# Patient Record
Sex: Female | Born: 1980 | Race: Asian | Hispanic: No | Marital: Single | Smoking: Current some day smoker
Health system: Southern US, Community
[De-identification: ages and names within clinical notes are randomized; demographics above are authoritative.]

## PROBLEM LIST (undated history)

## (undated) DIAGNOSIS — F32A Depression, unspecified: Secondary | ICD-10-CM

## (undated) DIAGNOSIS — S2239XA Fracture of one rib, unspecified side, initial encounter for closed fracture: Secondary | ICD-10-CM

## (undated) DIAGNOSIS — S2249XA Multiple fractures of ribs, unspecified side, initial encounter for closed fracture: Secondary | ICD-10-CM

## (undated) DIAGNOSIS — F329 Major depressive disorder, single episode, unspecified: Secondary | ICD-10-CM

## (undated) DIAGNOSIS — G43909 Migraine, unspecified, not intractable, without status migrainosus: Secondary | ICD-10-CM

## (undated) DIAGNOSIS — M549 Dorsalgia, unspecified: Secondary | ICD-10-CM

## (undated) DIAGNOSIS — L309 Dermatitis, unspecified: Secondary | ICD-10-CM

## (undated) DIAGNOSIS — G8929 Other chronic pain: Secondary | ICD-10-CM

## (undated) DIAGNOSIS — F419 Anxiety disorder, unspecified: Secondary | ICD-10-CM

## (undated) HISTORY — DX: Fracture of one rib, unspecified side, initial encounter for closed fracture: S22.39XA

## (undated) HISTORY — DX: Anxiety disorder, unspecified: F41.9

## (undated) HISTORY — PX: NO PAST SURGERIES: SHX2092

## (undated) HISTORY — DX: Major depressive disorder, single episode, unspecified: F32.9

## (undated) HISTORY — DX: Dermatitis, unspecified: L30.9

## (undated) HISTORY — DX: Depression, unspecified: F32.A

## (undated) HISTORY — DX: Multiple fractures of ribs, unspecified side, initial encounter for closed fracture: S22.49XA

## (undated) HISTORY — DX: Other chronic pain: G89.29

## (undated) HISTORY — DX: Dorsalgia, unspecified: M54.9

---

## 2002-03-22 ENCOUNTER — Other Ambulatory Visit: Admission: RE | Admit: 2002-03-22 | Discharge: 2002-03-22 | Payer: Self-pay | Admitting: *Deleted

## 2002-06-11 ENCOUNTER — Encounter: Payer: Self-pay | Admitting: Emergency Medicine

## 2002-06-11 ENCOUNTER — Emergency Department (HOSPITAL_COMMUNITY): Admission: EM | Admit: 2002-06-11 | Discharge: 2002-06-11 | Payer: Self-pay | Admitting: Emergency Medicine

## 2006-03-03 ENCOUNTER — Other Ambulatory Visit: Admission: RE | Admit: 2006-03-03 | Discharge: 2006-03-03 | Payer: Self-pay | Admitting: Obstetrics and Gynecology

## 2006-09-29 ENCOUNTER — Inpatient Hospital Stay (HOSPITAL_COMMUNITY): Admission: AD | Admit: 2006-09-29 | Discharge: 2006-09-29 | Payer: Self-pay | Admitting: Obstetrics and Gynecology

## 2006-10-01 ENCOUNTER — Inpatient Hospital Stay (HOSPITAL_COMMUNITY): Admission: AD | Admit: 2006-10-01 | Discharge: 2006-10-03 | Payer: Self-pay | Admitting: Obstetrics and Gynecology

## 2009-05-26 ENCOUNTER — Emergency Department (HOSPITAL_BASED_OUTPATIENT_CLINIC_OR_DEPARTMENT_OTHER): Admission: EM | Admit: 2009-05-26 | Discharge: 2009-05-26 | Payer: Self-pay | Admitting: Emergency Medicine

## 2013-06-12 ENCOUNTER — Emergency Department (HOSPITAL_BASED_OUTPATIENT_CLINIC_OR_DEPARTMENT_OTHER)
Admission: EM | Admit: 2013-06-12 | Discharge: 2013-06-12 | Disposition: A | Discharge status: Home / Self Care | Payer: BC Managed Care – PPO | Attending: Emergency Medicine | Admitting: Emergency Medicine

## 2013-06-12 ENCOUNTER — Encounter (HOSPITAL_BASED_OUTPATIENT_CLINIC_OR_DEPARTMENT_OTHER): Payer: Self-pay | Admitting: Emergency Medicine

## 2013-06-12 ENCOUNTER — Emergency Department (HOSPITAL_BASED_OUTPATIENT_CLINIC_OR_DEPARTMENT_OTHER): Payer: BC Managed Care – PPO

## 2013-06-12 DIAGNOSIS — IMO0002 Reserved for concepts with insufficient information to code with codable children: Secondary | ICD-10-CM | POA: Insufficient documentation

## 2013-06-12 DIAGNOSIS — Z3202 Encounter for pregnancy test, result negative: Secondary | ICD-10-CM | POA: Insufficient documentation

## 2013-06-12 DIAGNOSIS — S301XXA Contusion of abdominal wall, initial encounter: Secondary | ICD-10-CM | POA: Insufficient documentation

## 2013-06-12 DIAGNOSIS — G43909 Migraine, unspecified, not intractable, without status migrainosus: Secondary | ICD-10-CM | POA: Insufficient documentation

## 2013-06-12 DIAGNOSIS — Z79899 Other long term (current) drug therapy: Secondary | ICD-10-CM | POA: Insufficient documentation

## 2013-06-12 DIAGNOSIS — Y9389 Activity, other specified: Secondary | ICD-10-CM | POA: Insufficient documentation

## 2013-06-12 DIAGNOSIS — Y929 Unspecified place or not applicable: Secondary | ICD-10-CM | POA: Insufficient documentation

## 2013-06-12 DIAGNOSIS — F17200 Nicotine dependence, unspecified, uncomplicated: Secondary | ICD-10-CM | POA: Insufficient documentation

## 2013-06-12 HISTORY — DX: Migraine, unspecified, not intractable, without status migrainosus: G43.909

## 2013-06-12 LAB — BASIC METABOLIC PANEL
BUN: 8 mg/dL (ref 6–23)
CO2: 26 mEq/L (ref 19–32)
Chloride: 103 mEq/L (ref 96–112)
GFR calc non Af Amer: >90 mL/min (ref >90)
Glucose, Bld: 94 mg/dL (ref 70–99)
Potassium: 3.9 mEq/L (ref 3.5–5.1)
Sodium: 137 mEq/L (ref 135–145)

## 2013-06-12 LAB — CBC WITH DIFFERENTIAL/PLATELET
Basophils Absolute: 0 10*3/uL (ref 0.0–0.1)
Basophils Relative: 1 % (ref 0–1)
Eosinophils Absolute: 0.2 10*3/uL (ref 0.0–0.7)
HCT: 34.5 % — ABNORMAL LOW (ref 36.0–46.0)
Hemoglobin: 11.3 g/dL — ABNORMAL LOW (ref 12.0–15.0)
Lymphocytes Relative: 28 % (ref 12–46)
Lymphs Abs: 1.4 10*3/uL (ref 0.7–4.0)
MCH: 28.3 pg (ref 26.0–34.0)
MCHC: 32.8 g/dL (ref 30.0–36.0)
Monocytes Absolute: 0.5 10*3/uL (ref 0.1–1.0)
Monocytes Relative: 10 % (ref 3–12)
Neutro Abs: 2.9 10*3/uL (ref 1.7–7.7)
Neutrophils Relative %: 58 % (ref 43–77)
Platelets: 227 10*3/uL (ref 150–400)
RBC: 3.99 MIL/uL (ref 3.87–5.11)
RDW: 12.3 % (ref 11.5–15.5)
WBC: 5 10*3/uL (ref 4.0–10.5)

## 2013-06-12 LAB — URINALYSIS, ROUTINE W REFLEX MICROSCOPIC
Glucose, UA: NEGATIVE mg/dL
Ketones, ur: NEGATIVE mg/dL
Leukocytes, UA: NEGATIVE
Nitrite: NEGATIVE
Specific Gravity, Urine: 1.016 (ref 1.005–1.030)
Urobilinogen, UA: 1 mg/dL (ref 0.0–1.0)
pH: 7 (ref 5.0–8.0)

## 2013-06-12 LAB — PREGNANCY, URINE: Preg Test, Ur: NEGATIVE

## 2013-06-12 MED ORDER — IOHEXOL 300 MG/ML  SOLN
50.0000 mL | Freq: Once | INTRAMUSCULAR | Status: AC | PRN
  Administered 2013-06-12: 50 mL via ORAL

## 2013-06-12 MED ORDER — IOHEXOL 300 MG/ML  SOLN
100.0000 mL | Freq: Once | INTRAMUSCULAR | Status: AC | PRN
  Administered 2013-06-12: 100 mL via INTRAVENOUS

## 2013-06-12 MED ORDER — MORPHINE SULFATE 2 MG/ML IJ SOLN
2.0000 mg | Freq: Once | INTRAMUSCULAR | Status: AC
  Administered 2013-06-12: 2 mg via INTRAVENOUS
  Filled 2013-06-12: qty 1

## 2013-06-12 MED ORDER — HYDROCODONE-ACETAMINOPHEN 5-325 MG PO TABS: 1.0000 | ORAL_TABLET | ORAL | Status: DC | PRN

## 2013-06-12 MED ORDER — ONDANSETRON HCL 4 MG/2ML IJ SOLN
4.0000 mg | Freq: Once | INTRAMUSCULAR | Status: AC
  Administered 2013-06-12: 4 mg via INTRAVENOUS
  Filled 2013-06-12: qty 2

## 2013-06-12 MED ORDER — SODIUM CHLORIDE 0.9 % IV BOLUS (SEPSIS)
500.0000 mL | Freq: Once | INTRAVENOUS | Status: AC
  Administered 2013-06-12: 500 mL via INTRAVENOUS

## 2013-06-12 NOTE — ED Notes (Signed)
Patients blood pressure reported to Dr. Blinda Leatherwood. Order received for NS bolus

## 2013-06-12 NOTE — ED Provider Notes (Addendum)
History    CSN: [id number] Arrival date & time 06/12/13  1239  First MD Initiated Contact with Patient 06/12/13 1306     Chief Complaint  Patient presents with  . Abdominal Pain   (Consider location/radiation/quality/duration/timing/severity/associated sxs/prior Treatment) HPI Comments: Patient presents to the ER for evaluation of abdominal pain. 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 She says that she noticed intermittently, with changes in position for a couple of days. Today, however, she has had constant more severe pain. She has not had any nausea, vomiting or diarrhea associated with symptoms. She says she can feel a lump in the right lower groin area. The area is tender to touch.  Patient is a 32 y.o. female presenting with abdominal pain.  Abdominal Pain Associated symptoms include abdominal pain.   Past Medical History  Diagnosis Date  . Migraines    History reviewed. No pertinent past surgical history. No family history on file. History  Substance Use Topics  . Smoking status: Current Every Day Smoker -- 0.50 packs/day  . Smokeless tobacco: Not on file  . Alcohol Use: No   OB History   Grav Para Term Preterm Abortions TAB SAB Ect Mult Living                 Review of Systems  Gastrointestinal: Positive for abdominal pain.  All other systems reviewed and are negative.    Allergies  Acetaminophen  Home Medications   Current Outpatient Rx  Name  Route  Sig  Dispense  Refill  . nortriptyline (PAMELOR) 10 MG capsule   Oral   Take 20 mg by mouth at bedtime.          BP 105/75  Pulse 97  Temp(Src) 98.3 F (36.8 C) (Oral)  Resp 16  Ht 5\' 2"  (1.575 m)  Wt 106 lb (48.081 kg)  BMI 19.38 kg/m2  SpO2 100%  LMP 06/05/2013 Physical Exam  Constitutional: She is oriented to person, place, and time. She appears well-developed and well-nourished. No distress.  HENT:    Head: Normocephalic and atraumatic.  Right Ear: Hearing normal.  Left Ear: Hearing normal.  Nose: Nose normal.  Mouth/Throat: Oropharynx is clear and moist and mucous membranes are normal.  Eyes: Conjunctivae and EOM are normal. Pupils are equal, round, and reactive to light.  Neck: Normal range of motion. Neck supple.  Cardiovascular: Regular rhythm, S1 normal and S2 normal.  Exam reveals no gallop and no friction rub.   No murmur heard. Pulmonary/Chest: Effort normal and breath sounds normal. No respiratory distress. She exhibits no tenderness.  Abdominal: Soft. Normal appearance and bowel sounds are normal. There is no hepatosplenomegaly. There is tenderness. There is no rebound, no guarding, no tenderness at McBurney's point and negative Murphy's sign. No hernia.    Musculoskeletal: Normal range of motion.  Neurological: She is alert and oriented to person, place, and time. She has normal strength. No cranial nerve deficit or sensory deficit. Coordination normal. GCS eye subscore is 4. GCS verbal subscore is 5. GCS motor subscore is 6.  Skin: Skin is warm, dry and intact. No rash noted. No cyanosis.  Psychiatric: She has a normal mood and affect. Her speech is normal and behavior is normal. Thought content normal.    ED Course  Procedures (including critical care time) Labs Reviewed  CBC WITH DIFFERENTIAL - Abnormal; Notable for the following:    Hemoglobin 11.3 (*)  HCT 34.5 (*)    All other components within normal limits  PREGNANCY, URINE  URINALYSIS, ROUTINE W REFLEX MICROSCOPIC  BASIC METABOLIC PANEL   Ct Abdomen Pelvis W Contrast  06/12/2013   *RADIOLOGY REPORT*  Clinical Data: Severe right lower quadrant abdominal pain.  CT ABDOMEN AND PELVIS WITH CONTRAST  Technique:  Multidetector CT imaging of the abdomen and pelvis was performed following the standard protocol during bolus administration of intravenous contrast.  Contrast: OMNIPAQUE IOHEXOL 300 MG/ML  SOLN,  50mL OMNIPAQUE IOHEXOL 300 MG/ML  SOLN  Comparison: None.  Findings: The lung bases are clear. No pleural or pericardial effusion.  There is a lesion in the right rectus abdominus muscle measuring 6.3 cm transverse by 3.3 cm AP by 8.8 cm cranial-caudal.  The lesion demonstrates mixed attenuation with a small focus of decreased attenuation inferiorly.  There appears to be a feeding vessel into the lesion.  The lesion creates mass effect on the urinary bladder.  The gallbladder, liver, spleen, adrenal glands, pancreas and kidneys appear normal.  No lymphadenopathy or fluid is seen within the abdomen.  Uterus and adnexa appear normal.  No focal bony abnormality is identified.  IMPRESSION: Lesion in the right rectus abdominus muscle does not have the typical appearance of hematoma and is worrisome for an underlying mass lesion.  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 Recommend correlation with non emergent MRI with contrast.   Original Report Authenticated By: Holley Dexter, M.D.   Diagnosis: Abdominal pain, likely abdominal rectus hematoma  MDM  Patient presents to the ER for evaluation of right-sided abdominal pain. Pain first was felt while doing crunches while working out. 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 This morning she did work out on the elliptical and lifted weights, pain significantly worsened. She did have some fullness in the right lower quadrant region, no obvious hernia. Unremarkable. CT scan shows mass in the abdominal rectus muscle. Although the CT scan does not have typical appearance of hematoma, clinically this is the most fitting process. She would require outpatient MRI to further delineate. Patient does not have a primary care provider. Because of this, patient will be discharged with pain medication, told to come back to the ER in one week if not improving. She was told to go to [facility] ER, as she would need MRI and  that is not generally available at this ER. She can follow up [facility] for [facility] care. I did discuss with her today the radiologist's concern about underlying lesion. She understands this.  Upon taking vital signs for discharge, patient found to be hypotensive. Blood pressure 79 systolic. This may be secondary to morphine administration. As the patient does have evidence of a hematoma, however, blood loss is considered. Patient given a 500 mL fluid bolus and blood pressure is back to 106 systolic where she came in. This is normal for her. Patient is ambulating without difficulty. We'll discharge, patient counseled to return to the ER call 911 if she is any dizziness, racing heartbeat or passing out.  Gilda Crease, MD 06/12/13 1606  Gilda Crease, MD 06/12/13 1607  Gilda Crease, MD 06/12/13 1640  Gilda Crease, MD 06/12/13 1705

## 2013-06-12 NOTE — ED Notes (Signed)
RLQ pain x3 days.  Worse today.  Denies N/V/D.

## 2013-06-12 NOTE — ED Notes (Signed)
MD at bedside. 

## 2016-03-14 ENCOUNTER — Ambulatory Visit: Payer: Self-pay | Admitting: Pediatrics

## 2017-07-16 ENCOUNTER — Other Ambulatory Visit: Payer: Self-pay | Admitting: Family Medicine

## 2017-07-16 DIAGNOSIS — R1031 Right lower quadrant pain: Secondary | ICD-10-CM

## 2017-08-27 ENCOUNTER — Ambulatory Visit
Admission: RE | Admit: 2017-08-27 | Discharge: 2017-08-27 | Disposition: A | Discharge status: Home / Self Care | Payer: BLUE CROSS/BLUE SHIELD | Source: Ambulatory Visit | Attending: Family Medicine | Admitting: Family Medicine

## 2017-08-27 DIAGNOSIS — R1031 Right lower quadrant pain: Secondary | ICD-10-CM

## 2017-08-27 MED ORDER — IOPAMIDOL (ISOVUE-300) INJECTION 61%
100.0000 mL | Freq: Once | INTRAVENOUS | Status: AC | PRN
  Administered 2017-08-27: 100 mL via INTRAVENOUS

## 2017-09-02 ENCOUNTER — Other Ambulatory Visit: Payer: Self-pay

## 2018-05-15 DIAGNOSIS — L03011 Cellulitis of right finger: Secondary | ICD-10-CM | POA: Diagnosis not present

## 2018-06-03 DIAGNOSIS — D485 Neoplasm of uncertain behavior of skin: Secondary | ICD-10-CM | POA: Diagnosis not present

## 2018-06-03 DIAGNOSIS — D224 Melanocytic nevi of scalp and neck: Secondary | ICD-10-CM | POA: Diagnosis not present

## 2018-06-03 DIAGNOSIS — L249 Irritant contact dermatitis, unspecified cause: Secondary | ICD-10-CM | POA: Diagnosis not present

## 2018-09-24 DIAGNOSIS — F320 Major depressive disorder, single episode, mild: Secondary | ICD-10-CM | POA: Diagnosis not present

## 2018-09-24 DIAGNOSIS — F419 Anxiety disorder, unspecified: Secondary | ICD-10-CM | POA: Diagnosis not present

## 2018-10-07 ENCOUNTER — Encounter: Payer: Self-pay | Admitting: Neurology

## 2018-10-07 ENCOUNTER — Ambulatory Visit: Payer: BLUE CROSS/BLUE SHIELD | Admitting: Neurology

## 2018-10-07 VITALS — BP 124/84 | HR 77 | Ht 62.0 in | Wt 115.0 lb

## 2018-10-07 DIAGNOSIS — M7918 Myalgia, other site: Secondary | ICD-10-CM | POA: Insufficient documentation

## 2018-10-07 DIAGNOSIS — G4440 Drug-induced headache, not elsewhere classified, not intractable: Secondary | ICD-10-CM

## 2018-10-07 DIAGNOSIS — G43001 Migraine without aura, not intractable, with status migrainosus: Secondary | ICD-10-CM | POA: Diagnosis not present

## 2018-10-07 NOTE — Patient Instructions (Signed)
Dry Needling for cervical muscle tightness Stop any OTC medications (ibuprofen, tylenol, excedrin, BC/goody powders)  Analgesic Rebound Headache An analgesic rebound headache, sometimes called a medication overuse headache, is a headache that comes after pain medicine (analgesic) taken to treat the original (primary) headache has worn off. Any type of primary headache can return as a rebound headache if a person regularly takes analgesics more than three times a week to treat it. The types of primary headaches that are commonly associated with rebound headaches include:  Migraines.  Headaches that arise from tense muscles in the head and neck area (tension headaches).  Headaches that develop and happen again (recur) on one side of the head and around the eye (cluster headaches).  If rebound headaches continue, they become chronic daily headaches. What are the causes? This condition may be caused by frequent use of:  Over-the-counter medicines such as aspirin, ibuprofen, and acetaminophen.  Sinus relief medicines and other medicines that contain caffeine.  Narcotic pain medicines such as codeine and oxycodone.  What are the signs or symptoms? The symptoms of a rebound headache are the same as the symptoms of the original headache. Some of the symptoms of specific types of headaches include: Migraine headache  Pulsing or throbbing pain on one or both sides of the head.  Severe pain that interferes with daily activities.  Pain that is worsened by physical activity.  Nausea, vomiting, or both.  Pain with exposure to bright light, loud noises, or strong smells.  General sensitivity to bright light, loud noises, or strong smells.  Visual changes.  Numbness of one or both arms. Tension headache  Pressure around the head.  Dull, aching head pain.  Pain felt over the front and sides of the head.  Tenderness in the muscles of the head, neck, and shoulders. Cluster  headache  Severe pain that begins in or around one eye or temple.  Redness and tearing in the eye on the same side as the pain.  Droopy or swollen eyelid.  One-sided head pain.  Nausea.  Runny nose.  Sweaty, pale facial skin.  Restlessness. How is this diagnosed? This condition is diagnosed by:  Reviewing your medical history. This includes the nature of your primary headaches.  Reviewing the types of pain medicines that you have been using to treat your headaches and how often you take them.  How is this treated? This condition may be treated or managed by:  Discontinuing frequent use of the analgesic medicine. Doing this may worsen your headaches at first, but the pain should eventually become more manageable, less frequent, and less severe.  Seeing a headache specialist. He or she may be able to help you manage your headaches and help make sure there is not another cause of the headaches.  Using methods of stress relief, such as acupuncture, counseling, biofeedback, and massage. Talk with your health care provider about which methods might be good for you.  Follow these instructions at home:  Take over-the-counter and prescription medicines only as told by your health care provider.  Stop the repeated use of pain medicine as told by your health care provider. Stopping can be difficult. Carefully follow instructions from your health care provider.  Avoid triggers that are known to cause your primary headaches.  Keep all follow-up visits as told by your health care provider. This is important. Contact a health care provider if:  You continue to experience headaches after following treatments that your health care provider recommended. Get help right  away if:  You develop new headache pain.  You develop headache pain that is different than what you have experienced in the past.  You develop numbness or tingling in your arms or legs.  You develop changes in your  speech or vision. This information is not intended to replace advice given to you by your health care provider. Make sure you discuss any questions you have with your health care provider. Document Released: 02/01/2004 Document Revised: 05/31/2016 Document Reviewed: 04/15/2016 Elsevier Interactive Patient Education  [employer]2018 [employer]
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
## 2018-10-19 DIAGNOSIS — L2089 Other atopic dermatitis: Secondary | ICD-10-CM | POA: Diagnosis not present

## 2018-12-30 DIAGNOSIS — Z13220 Encounter for screening for lipoid disorders: Secondary | ICD-10-CM | POA: Diagnosis not present

## 2018-12-30 DIAGNOSIS — Z0000 Encounter for general adult medical examination without abnormal findings: Secondary | ICD-10-CM | POA: Diagnosis not present

## 2019-02-08 DIAGNOSIS — J301 Allergic rhinitis due to pollen: Secondary | ICD-10-CM | POA: Diagnosis not present

## 2019-02-08 DIAGNOSIS — J069 Acute upper respiratory infection, unspecified: Secondary | ICD-10-CM | POA: Diagnosis not present

## 2019-02-08 DIAGNOSIS — R509 Fever, unspecified: Secondary | ICD-10-CM | POA: Diagnosis not present

## 2019-02-08 DIAGNOSIS — J3489 Other specified disorders of nose and nasal sinuses: Secondary | ICD-10-CM | POA: Diagnosis not present

## 2019-05-28 DIAGNOSIS — Z1159 Encounter for screening for other viral diseases: Secondary | ICD-10-CM | POA: Diagnosis not present

## 2019-05-28 DIAGNOSIS — J45998 Other asthma: Secondary | ICD-10-CM | POA: Diagnosis not present

## 2019-06-22 DIAGNOSIS — J358 Other chronic diseases of tonsils and adenoids: Secondary | ICD-10-CM | POA: Diagnosis not present

## 2019-06-27 DIAGNOSIS — R062 Wheezing: Secondary | ICD-10-CM | POA: Diagnosis not present

## 2019-06-27 DIAGNOSIS — R05 Cough: Secondary | ICD-10-CM | POA: Diagnosis not present

## 2019-06-27 DIAGNOSIS — R0981 Nasal congestion: Secondary | ICD-10-CM | POA: Diagnosis not present

## 2019-06-27 DIAGNOSIS — Z20828 Contact with and (suspected) exposure to other viral communicable diseases: Secondary | ICD-10-CM | POA: Diagnosis not present

## 2019-08-06 DIAGNOSIS — R05 Cough: Secondary | ICD-10-CM | POA: Diagnosis not present

## 2019-08-06 DIAGNOSIS — G43909 Migraine, unspecified, not intractable, without status migrainosus: Secondary | ICD-10-CM | POA: Diagnosis not present

## 2019-08-06 DIAGNOSIS — Z23 Encounter for immunization: Secondary | ICD-10-CM | POA: Diagnosis not present

## 2019-08-06 DIAGNOSIS — F419 Anxiety disorder, unspecified: Secondary | ICD-10-CM | POA: Diagnosis not present

## 2019-08-06 DIAGNOSIS — R635 Abnormal weight gain: Secondary | ICD-10-CM | POA: Diagnosis not present

## 2019-08-16 ENCOUNTER — Other Ambulatory Visit: Payer: Self-pay

## 2019-08-16 ENCOUNTER — Ambulatory Visit
Admission: RE | Admit: 2019-08-16 | Discharge: 2019-08-16 | Disposition: A | Discharge status: Home / Self Care | Payer: BC Managed Care – PPO | Source: Ambulatory Visit | Attending: Family Medicine | Admitting: Family Medicine

## 2019-08-16 ENCOUNTER — Other Ambulatory Visit: Payer: Self-pay | Admitting: Family Medicine

## 2019-08-16 DIAGNOSIS — R05 Cough: Secondary | ICD-10-CM | POA: Diagnosis not present

## 2019-08-16 DIAGNOSIS — R053 Chronic cough: Secondary | ICD-10-CM

## 2019-12-01 DIAGNOSIS — Z20822 Contact with and (suspected) exposure to covid-19: Secondary | ICD-10-CM | POA: Diagnosis not present

## 2019-12-01 DIAGNOSIS — R1033 Periumbilical pain: Secondary | ICD-10-CM | POA: Diagnosis not present

## 2019-12-01 DIAGNOSIS — R509 Fever, unspecified: Secondary | ICD-10-CM | POA: Diagnosis not present

## 2020-01-04 DIAGNOSIS — R1011 Right upper quadrant pain: Secondary | ICD-10-CM | POA: Diagnosis not present

## 2020-01-04 DIAGNOSIS — R112 Nausea with vomiting, unspecified: Secondary | ICD-10-CM | POA: Diagnosis not present

## 2020-01-04 DIAGNOSIS — R1013 Epigastric pain: Secondary | ICD-10-CM | POA: Diagnosis not present

## 2020-01-06 ENCOUNTER — Other Ambulatory Visit: Payer: Self-pay | Admitting: Internal Medicine

## 2020-01-06 ENCOUNTER — Other Ambulatory Visit: Payer: Self-pay | Admitting: Gastroenterology

## 2020-01-06 DIAGNOSIS — R112 Nausea with vomiting, unspecified: Secondary | ICD-10-CM

## 2020-01-06 DIAGNOSIS — R1011 Right upper quadrant pain: Secondary | ICD-10-CM

## 2020-01-07 ENCOUNTER — Ambulatory Visit
Admission: RE | Admit: 2020-01-07 | Discharge: 2020-01-07 | Disposition: A | Discharge status: Home / Self Care | Payer: BC Managed Care – PPO | Source: Ambulatory Visit | Attending: Internal Medicine | Admitting: Internal Medicine

## 2020-01-07 DIAGNOSIS — R109 Unspecified abdominal pain: Secondary | ICD-10-CM | POA: Diagnosis not present

## 2020-01-07 DIAGNOSIS — R1011 Right upper quadrant pain: Secondary | ICD-10-CM

## 2020-01-07 DIAGNOSIS — R112 Nausea with vomiting, unspecified: Secondary | ICD-10-CM

## 2020-01-20 DIAGNOSIS — Z131 Encounter for screening for diabetes mellitus: Secondary | ICD-10-CM | POA: Diagnosis not present

## 2020-01-20 DIAGNOSIS — E559 Vitamin D deficiency, unspecified: Secondary | ICD-10-CM | POA: Diagnosis not present

## 2020-01-20 DIAGNOSIS — Z13220 Encounter for screening for lipoid disorders: Secondary | ICD-10-CM | POA: Diagnosis not present

## 2020-01-20 DIAGNOSIS — Z0000 Encounter for general adult medical examination without abnormal findings: Secondary | ICD-10-CM | POA: Diagnosis not present

## 2020-01-20 DIAGNOSIS — J4530 Mild persistent asthma, uncomplicated: Secondary | ICD-10-CM | POA: Diagnosis not present

## 2020-02-04 DIAGNOSIS — Z1159 Encounter for screening for other viral diseases: Secondary | ICD-10-CM | POA: Diagnosis not present

## 2020-02-09 DIAGNOSIS — K3189 Other diseases of stomach and duodenum: Secondary | ICD-10-CM | POA: Diagnosis not present

## 2020-02-09 DIAGNOSIS — R1011 Right upper quadrant pain: Secondary | ICD-10-CM | POA: Diagnosis not present

## 2020-02-09 DIAGNOSIS — K222 Esophageal obstruction: Secondary | ICD-10-CM | POA: Diagnosis not present

## 2020-02-09 DIAGNOSIS — R1013 Epigastric pain: Secondary | ICD-10-CM | POA: Diagnosis not present

## 2020-02-09 DIAGNOSIS — K319 Disease of stomach and duodenum, unspecified: Secondary | ICD-10-CM | POA: Diagnosis not present

## 2020-02-09 DIAGNOSIS — K259 Gastric ulcer, unspecified as acute or chronic, without hemorrhage or perforation: Secondary | ICD-10-CM | POA: Diagnosis not present

## 2020-02-09 DIAGNOSIS — K449 Diaphragmatic hernia without obstruction or gangrene: Secondary | ICD-10-CM | POA: Diagnosis not present

## 2020-03-16 DIAGNOSIS — R05 Cough: Secondary | ICD-10-CM | POA: Diagnosis not present

## 2020-03-16 DIAGNOSIS — Z20822 Contact with and (suspected) exposure to covid-19: Secondary | ICD-10-CM | POA: Diagnosis not present

## 2020-03-16 DIAGNOSIS — J301 Allergic rhinitis due to pollen: Secondary | ICD-10-CM | POA: Diagnosis not present

## 2020-03-16 DIAGNOSIS — Z7951 Long term (current) use of inhaled steroids: Secondary | ICD-10-CM | POA: Diagnosis not present

## 2020-03-22 DIAGNOSIS — Z124 Encounter for screening for malignant neoplasm of cervix: Secondary | ICD-10-CM | POA: Diagnosis not present

## 2020-03-22 DIAGNOSIS — Z01419 Encounter for gynecological examination (general) (routine) without abnormal findings: Secondary | ICD-10-CM | POA: Diagnosis not present

## 2020-03-22 DIAGNOSIS — Z3041 Encounter for surveillance of contraceptive pills: Secondary | ICD-10-CM | POA: Diagnosis not present

## 2020-03-22 DIAGNOSIS — Z1151 Encounter for screening for human papillomavirus (HPV): Secondary | ICD-10-CM | POA: Diagnosis not present

## 2020-04-19 DIAGNOSIS — E7800 Pure hypercholesterolemia, unspecified: Secondary | ICD-10-CM | POA: Diagnosis not present

## 2020-04-19 DIAGNOSIS — R7309 Other abnormal glucose: Secondary | ICD-10-CM | POA: Diagnosis not present

## 2020-05-03 IMAGING — US US ABDOMEN LIMITED
1 series · 14 of 25 positions shown · non-contrast
Comparison: None.

CLINICAL DATA: 38-year-old female with RIGHT abdominal pain, nausea
and vomiting.

EXAM:
ULTRASOUND ABDOMEN LIMITED RIGHT UPPER QUADRANT

[Series 1: us abdomen limited · 0.17mm/px · 14 of 47 slices shown]
[im 1/47]
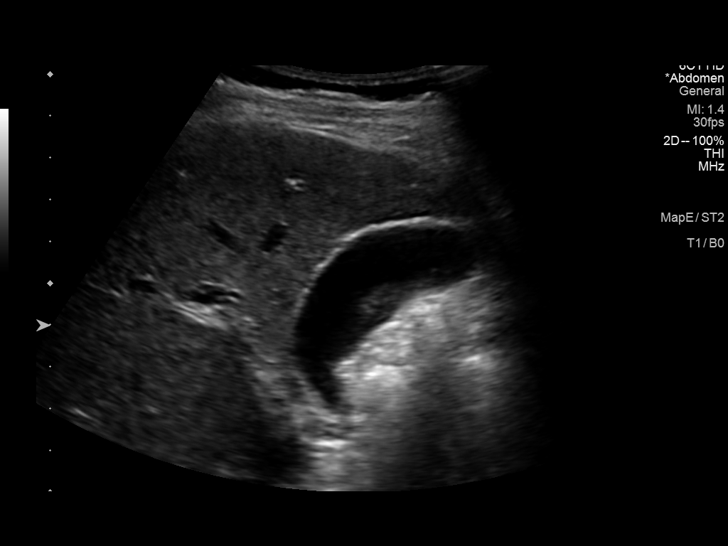
[im 4/47]
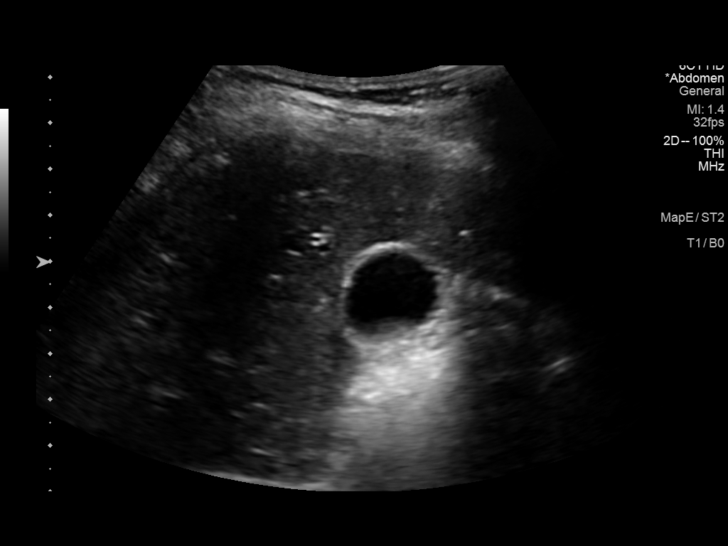
[im 8/47]
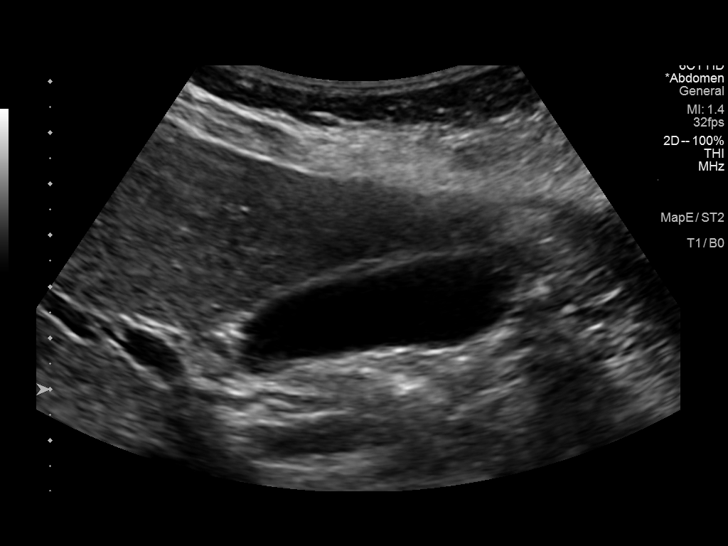
[im 12/47]
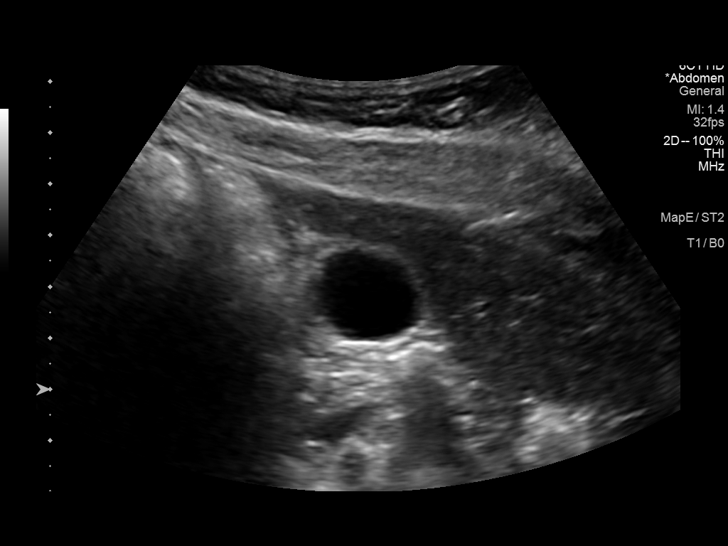
[im 16/47]
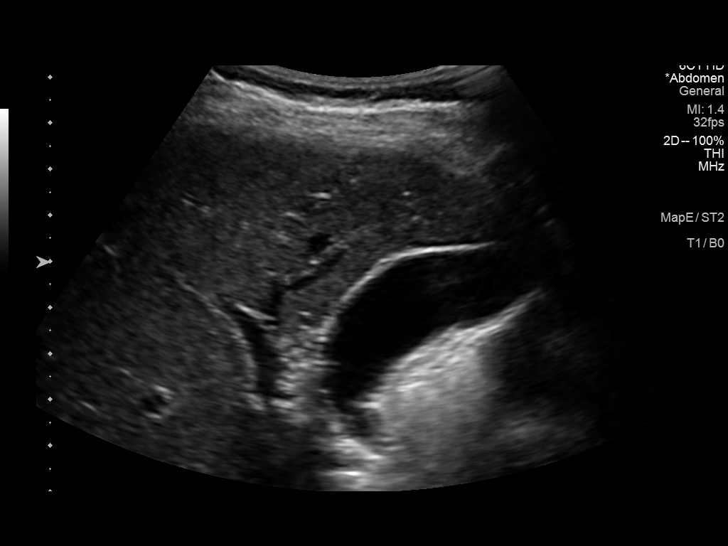
[im 18/47]
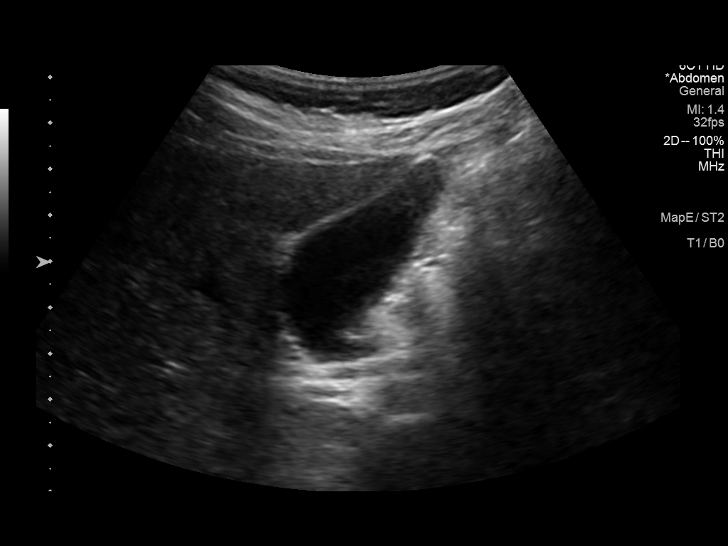
[im 22/47]
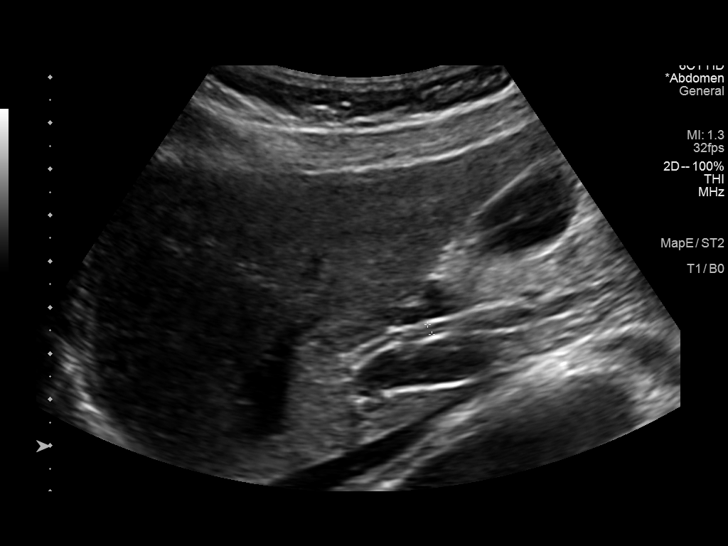
[im 25/47]
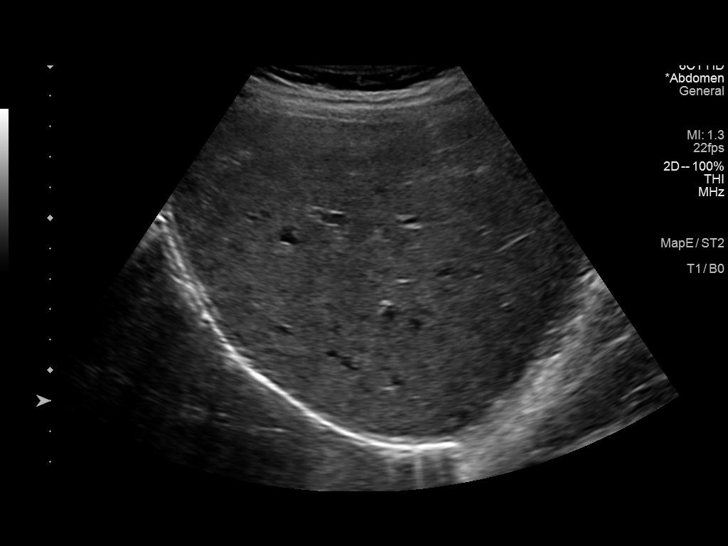
[im 29/47]
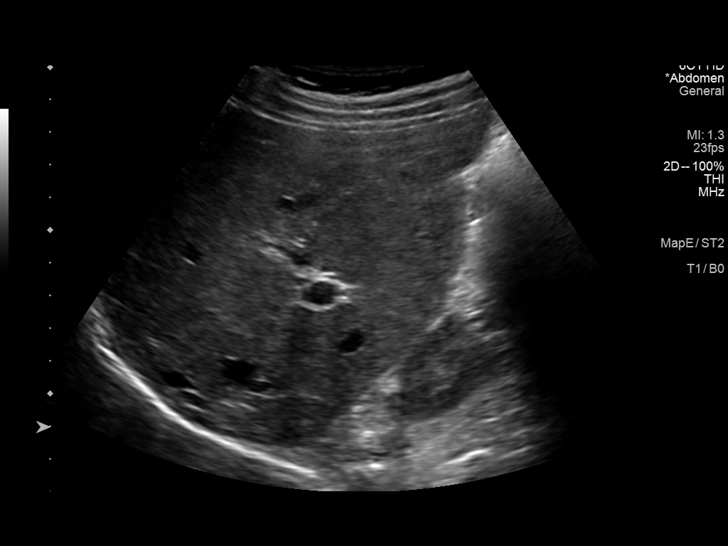
[im 31/47]
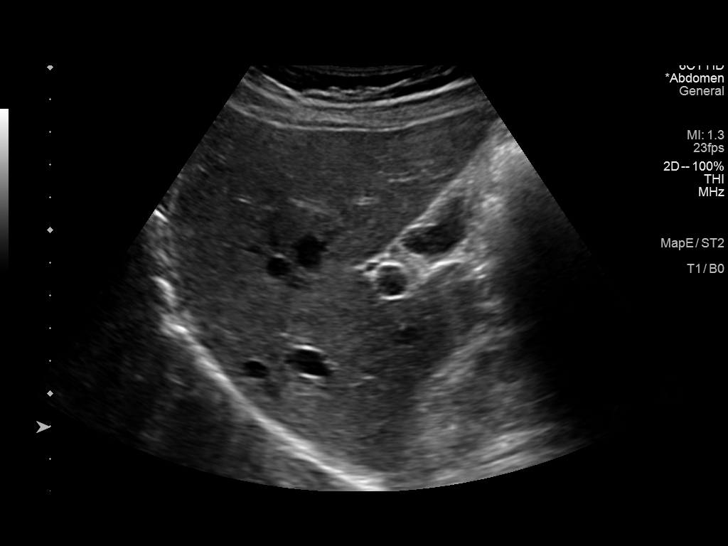
[im 35/47]
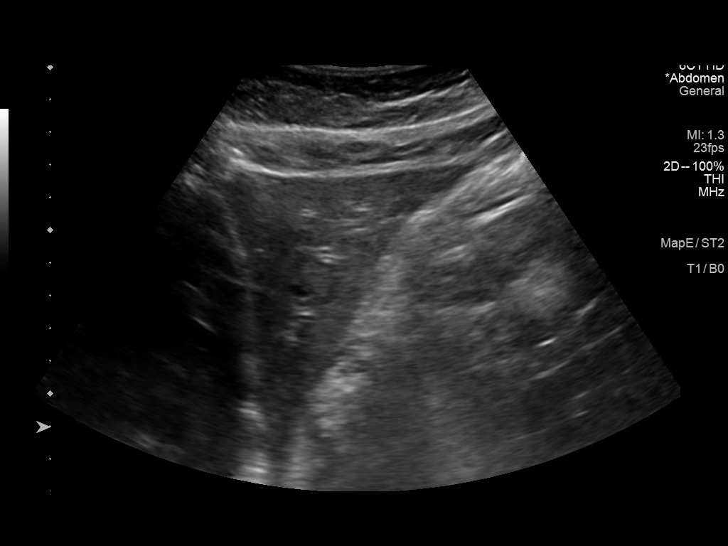
[im 39/47]
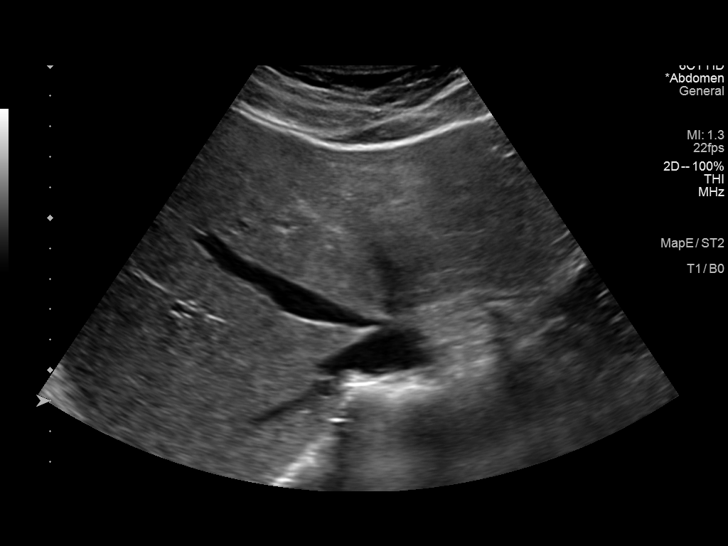
[im 43/47]
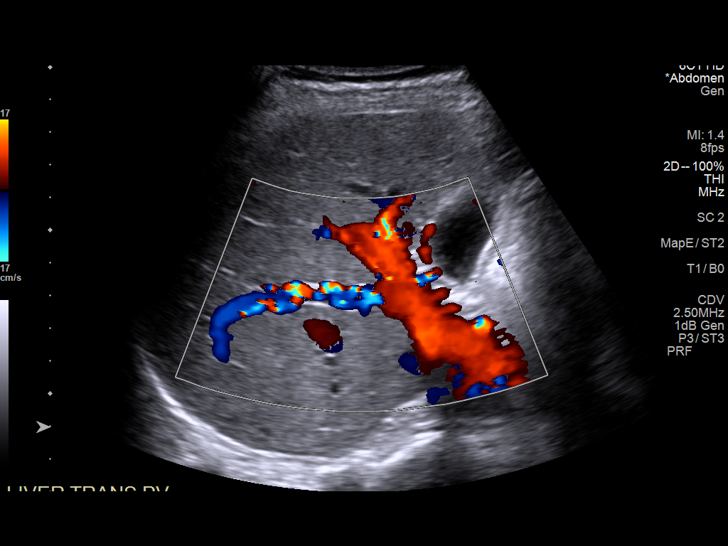
[im 47/47]
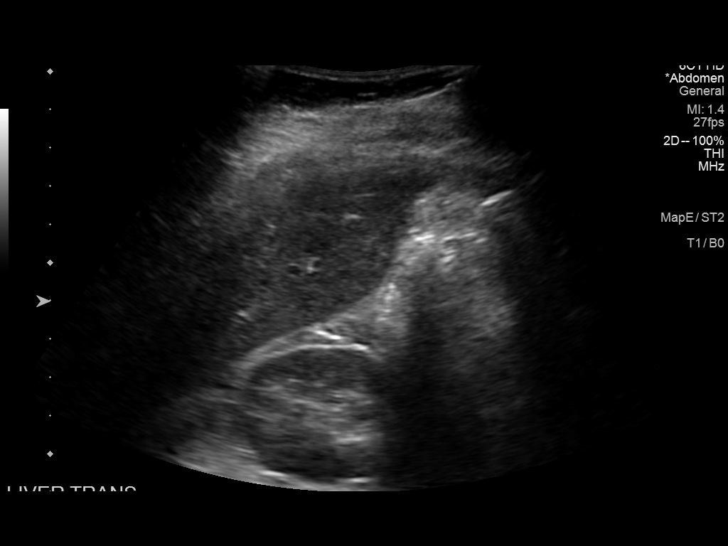

[14 of 25 positions shown; findings below may reference images not displayed]

FINDINGS: Gallbladder:

The gallbladder is unremarkable. There is no evidence of
cholelithiasis or acute cholecystitis.

Common bile duct:

Diameter: 2.4 mm. No intrahepatic or extrahepatic biliary dilatation
identified.

Liver:

No focal lesion identified. Within normal limits in parenchymal
echogenicity. Portal vein is patent on color Doppler imaging with
normal direction of blood flow towards the liver.

Other: None.
IMPRESSION: Unremarkable RIGHT UPPER quadrant abdominal ultrasound.

## 2020-05-08 ENCOUNTER — Ambulatory Visit: Payer: BC Managed Care – PPO | Attending: Internal Medicine

## 2020-05-19 DIAGNOSIS — M2550 Pain in unspecified joint: Secondary | ICD-10-CM | POA: Diagnosis not present

## 2020-05-19 DIAGNOSIS — M21619 Bunion of unspecified foot: Secondary | ICD-10-CM | POA: Diagnosis not present

## 2020-05-19 DIAGNOSIS — M79641 Pain in right hand: Secondary | ICD-10-CM | POA: Diagnosis not present

## 2020-05-19 DIAGNOSIS — M25561 Pain in right knee: Secondary | ICD-10-CM | POA: Diagnosis not present

## 2020-05-19 DIAGNOSIS — M79644 Pain in right finger(s): Secondary | ICD-10-CM | POA: Diagnosis not present

## 2020-06-07 DIAGNOSIS — M79644 Pain in right finger(s): Secondary | ICD-10-CM | POA: Diagnosis not present

## 2020-06-07 DIAGNOSIS — M25539 Pain in unspecified wrist: Secondary | ICD-10-CM | POA: Diagnosis not present

## 2020-06-07 DIAGNOSIS — M25342 Other instability, left hand: Secondary | ICD-10-CM | POA: Diagnosis not present

## 2020-06-07 DIAGNOSIS — M25341 Other instability, right hand: Secondary | ICD-10-CM | POA: Diagnosis not present

## 2020-06-07 DIAGNOSIS — R2230 Localized swelling, mass and lump, unspecified upper limb: Secondary | ICD-10-CM | POA: Diagnosis not present

## 2020-06-07 DIAGNOSIS — M79645 Pain in left finger(s): Secondary | ICD-10-CM | POA: Diagnosis not present

## 2020-06-07 DIAGNOSIS — M25531 Pain in right wrist: Secondary | ICD-10-CM | POA: Diagnosis not present

## 2020-07-25 DIAGNOSIS — J029 Acute pharyngitis, unspecified: Secondary | ICD-10-CM | POA: Diagnosis not present

## 2020-07-25 DIAGNOSIS — J069 Acute upper respiratory infection, unspecified: Secondary | ICD-10-CM | POA: Diagnosis not present

## 2020-08-30 DIAGNOSIS — R7303 Prediabetes: Secondary | ICD-10-CM | POA: Diagnosis not present

## 2020-11-28 DIAGNOSIS — J069 Acute upper respiratory infection, unspecified: Secondary | ICD-10-CM | POA: Diagnosis not present

## 2020-11-30 DIAGNOSIS — Z20822 Contact with and (suspected) exposure to covid-19: Secondary | ICD-10-CM | POA: Diagnosis not present

## 2021-03-21 ENCOUNTER — Ambulatory Visit: Payer: Self-pay

## 2021-04-05 ENCOUNTER — Emergency Department
Admission: EM | Admit: 2021-04-05 | Discharge: 2021-04-05 | Disposition: A | Discharge status: Home / Self Care | Payer: BC Managed Care – PPO | Source: Home / Self Care | Attending: Family Medicine | Admitting: Family Medicine

## 2021-04-05 ENCOUNTER — Emergency Department: Admit: 2021-04-05 | Discharge status: Absent without leave | Payer: Self-pay

## 2021-04-05 DIAGNOSIS — R0981 Nasal congestion: Secondary | ICD-10-CM

## 2021-04-05 DIAGNOSIS — Z9109 Other allergy status, other than to drugs and biological substances: Secondary | ICD-10-CM

## 2021-04-05 MED ORDER — LEVOCETIRIZINE DIHYDROCHLORIDE 5 MG PO TABS: 5.0000 mg | ORAL_TABLET | Freq: Every evening | ORAL | 0 refills | Status: AC

## 2021-04-05 MED ORDER — PREDNISONE 20 MG PO TABS: ORAL_TABLET | ORAL | 0 refills | Status: AC

## 2021-04-05 MED ORDER — DOXYCYCLINE HYCLATE 100 MG PO CAPS: 100.0000 mg | ORAL_CAPSULE | Freq: Two times a day (BID) | ORAL | 0 refills | Status: AC
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
## 2021-04-05 NOTE — ED Triage Notes (Signed)
Pt c/o congestion and headaches x 3 weeks. Was given antibiotics. (4/14) Also having some nausea. Had COVID in Jan and states she had intermittent congestion since then. Hx of allergies. Takes zyrtec daily and flovent qhs. Advil prn.

## 2021-04-05 NOTE — Discharge Instructions (Signed)
Continue with your Flovent, Flonase, Singulair Stop Zyrtec and try Xyzal Take prednisone as directed Take doxycycline 2 times a day with food Allergy specialist follow-up as recommended

## 2021-04-07 LAB — COVID-19, FLU A+B NAA
Influenza A, NAA: NOT DETECTED
Influenza B, NAA: NOT DETECTED
SARS-CoV-2, NAA: NOT DETECTED

## 2021-05-18 DIAGNOSIS — E7800 Pure hypercholesterolemia, unspecified: Secondary | ICD-10-CM | POA: Diagnosis not present

## 2021-05-18 DIAGNOSIS — Z0000 Encounter for general adult medical examination without abnormal findings: Secondary | ICD-10-CM | POA: Diagnosis not present

## 2021-05-18 DIAGNOSIS — Z131 Encounter for screening for diabetes mellitus: Secondary | ICD-10-CM | POA: Diagnosis not present

## 2021-05-23 ENCOUNTER — Other Ambulatory Visit: Payer: Self-pay | Admitting: Family Medicine

## 2021-05-23 DIAGNOSIS — R519 Headache, unspecified: Secondary | ICD-10-CM

## 2021-06-07 ENCOUNTER — Other Ambulatory Visit: Payer: BC Managed Care – PPO

## 2021-06-13 ENCOUNTER — Other Ambulatory Visit: Payer: Self-pay

## 2021-06-13 ENCOUNTER — Ambulatory Visit
Admission: RE | Admit: 2021-06-13 | Discharge: 2021-06-13 | Disposition: A | Discharge status: Home / Self Care | Payer: BC Managed Care – PPO | Source: Ambulatory Visit | Attending: Family Medicine | Admitting: Family Medicine

## 2021-06-13 DIAGNOSIS — R519 Headache, unspecified: Secondary | ICD-10-CM

## 2021-08-06 DIAGNOSIS — F419 Anxiety disorder, unspecified: Secondary | ICD-10-CM | POA: Diagnosis not present

## 2021-08-06 DIAGNOSIS — F321 Major depressive disorder, single episode, moderate: Secondary | ICD-10-CM | POA: Diagnosis not present

## 2021-09-20 DIAGNOSIS — R519 Headache, unspecified: Secondary | ICD-10-CM | POA: Diagnosis not present

## 2021-09-20 DIAGNOSIS — J309 Allergic rhinitis, unspecified: Secondary | ICD-10-CM | POA: Diagnosis not present

## 2021-10-03 DIAGNOSIS — R519 Headache, unspecified: Secondary | ICD-10-CM | POA: Diagnosis not present

## 2021-10-03 DIAGNOSIS — Z01419 Encounter for gynecological examination (general) (routine) without abnormal findings: Secondary | ICD-10-CM | POA: Diagnosis not present

## 2021-10-03 DIAGNOSIS — Z793 Long term (current) use of hormonal contraceptives: Secondary | ICD-10-CM | POA: Diagnosis not present

## 2021-10-08 IMAGING — MR MR HEAD W/O CM
11 series · 48 of 48 positions shown · non-contrast
Comparison: None.

CLINICAL DATA: Chronic daily headaches.

EXAM:
MRI HEAD WITHOUT CONTRAST
TECHNIQUE: Multiplanar, multiecho pulse sequences of the brain and surrounding
structures were obtained without intravenous contrast.

[Series 5: T1 · sagittal · 4.0mm · 0.75mm/px · 2 of 31 slices shown (1 of 2)]
[im 1/31]
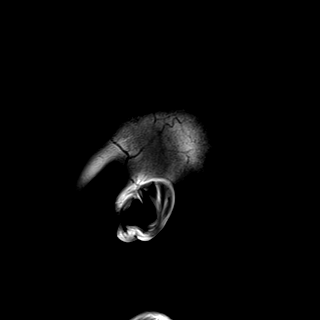
[im 31/31]
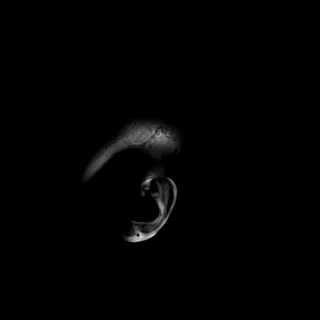

[Series 6: DWI · axial · 3.0mm · 0.94mm/px · z∈[-65,+75]mm · 10 of 160 slices shown (1 of 3)]
[im 1/160]
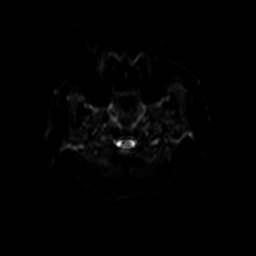
[im 18/160]
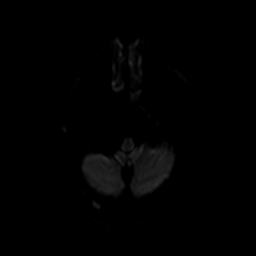
[im 36/160]
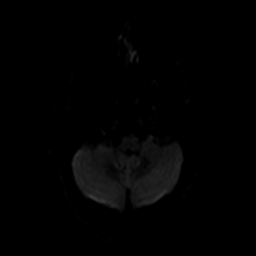
[im 54/160]
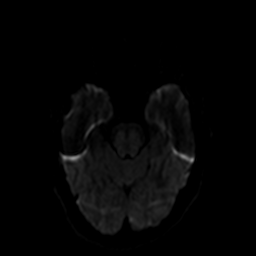
[im 71/160]
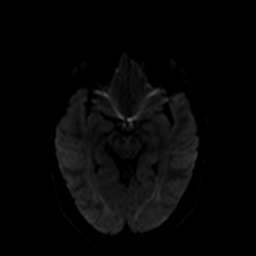
[im 89/160]
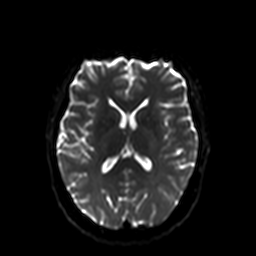
[im 107/160]
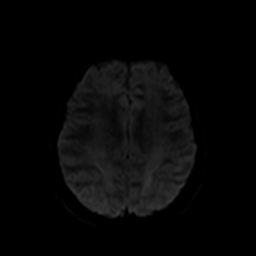
[im 124/160]
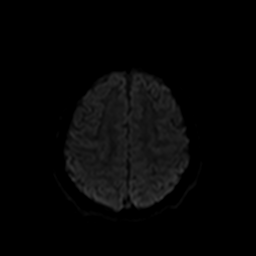
[im 142/160]
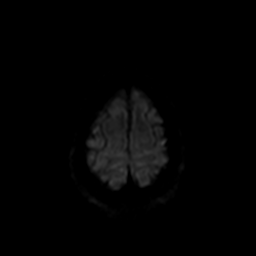
[im 160/160]
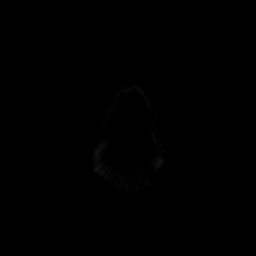

[Series 7: ax dwi_tracew · axial · 3.0mm · 0.94mm/px · z∈[-65,+75]mm · 5 of 80 slices shown]
[im 1/80]
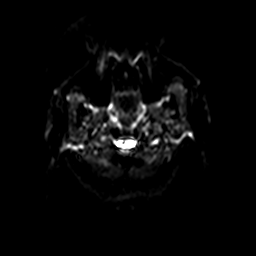
[im 20/80]
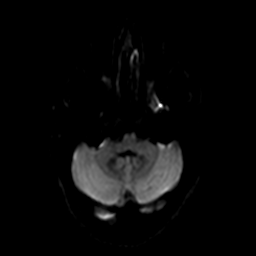
[im 40/80]
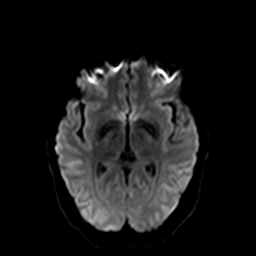
[im 60/80]
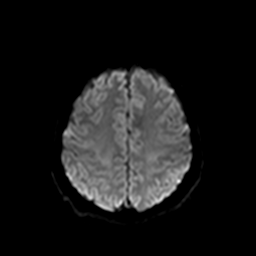
[im 80/80]
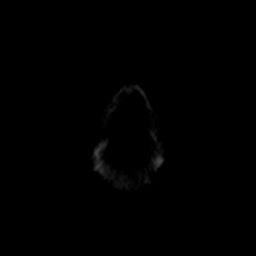

[Series 8: ax dwi_adc · axial · 3.0mm · 0.94mm/px · z∈[-65,+75]mm · 3 of 40 slices shown]
[im 1/40]
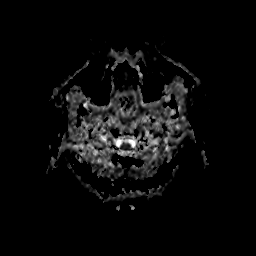
[im 20/40]
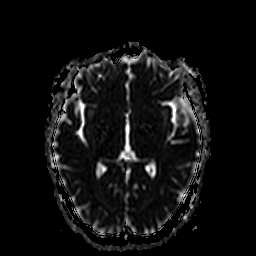
[im 40/40]
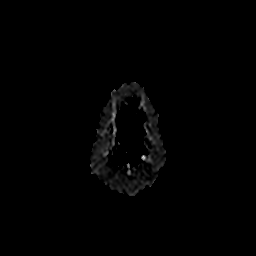

[Series 9: DWI · coronal · 5.0mm · 1.44mm/px · 4 of 64 slices shown (2 of 3)]
[im 1/64]
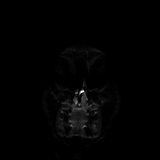
[im 22/64]
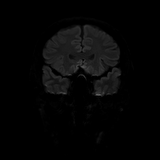
[im 43/64]
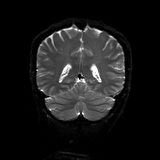
[im 64/64]
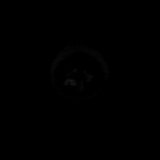

[Series 10: DWI · coronal · 5.0mm · 1.44mm/px · 2 of 32 slices shown (3 of 3)]
[im 1/32]
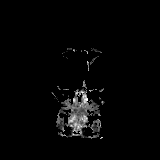
[im 32/32]
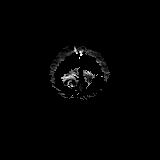

[Series 11: T2 · axial · 4.0mm · 0.36mm/px · z∈[-64,+76]mm · 2 of 28 slices shown (1 of 2)]
[im 1/28]
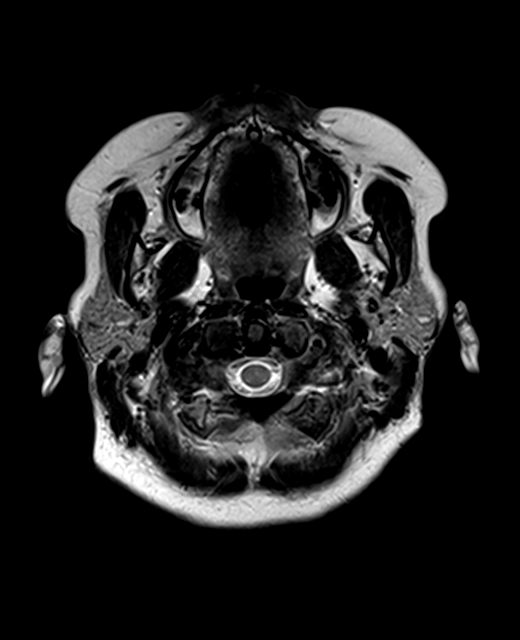
[im 28/28]
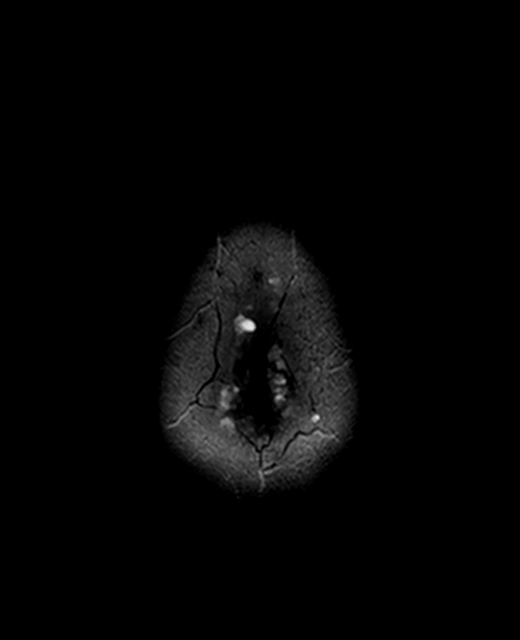

[Series 12: FLAIR · axial · 3.0mm · 0.72mm/px · z∈[-70,+80]mm · 2 of 26 slices shown]
[im 1/26]
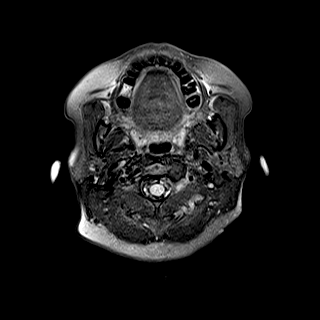
[im 26/26]
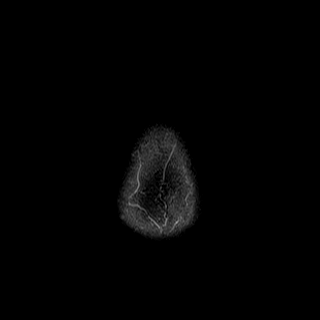

[Series 13: swi_images · axial · 1.5mm · 0.90mm/px · z∈[-65,+78]mm · 6 of 96 slices shown]
[im 1/96]
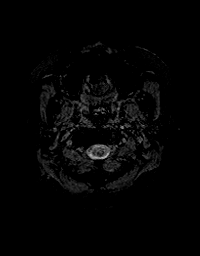
[im 20/96]
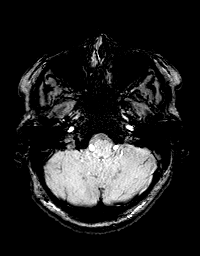
[im 39/96]
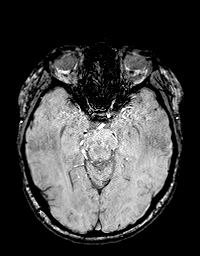
[im 58/96]
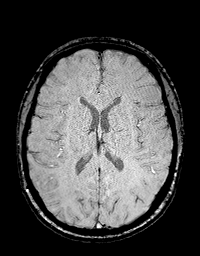
[im 77/96]
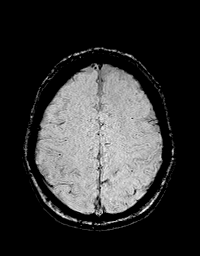
[im 96/96]
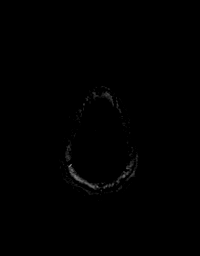

[Series 15: T1 · axial · 1.0mm · 0.94mm/px · z∈[-73,+85]mm · 10 of 160 slices shown (2 of 2)]
[im 1/160]
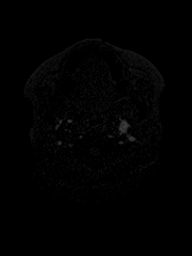
[im 18/160]
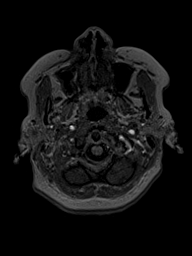
[im 36/160]
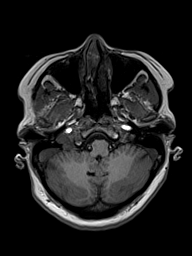
[im 54/160]
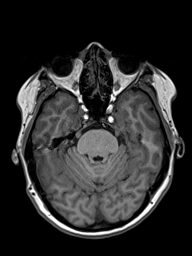
[im 71/160]
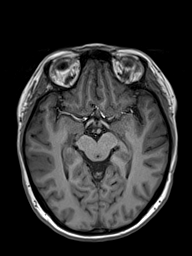
[im 89/160]
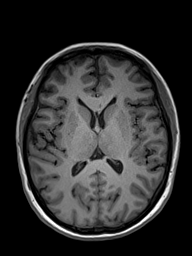
[im 107/160]
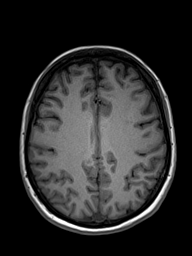
[im 124/160]
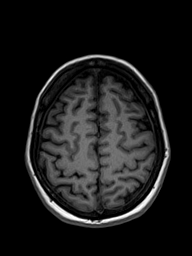
[im 142/160]
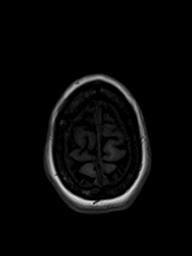
[im 160/160]
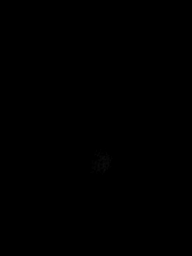

[Series 16: T2 · coronal · 4.5mm · 0.36mm/px · 2 of 30 slices shown (2 of 2)]
[im 1/30]
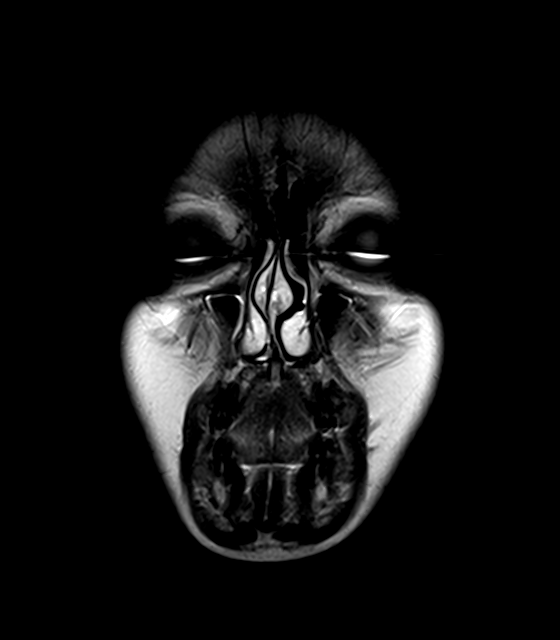
[im 30/30]
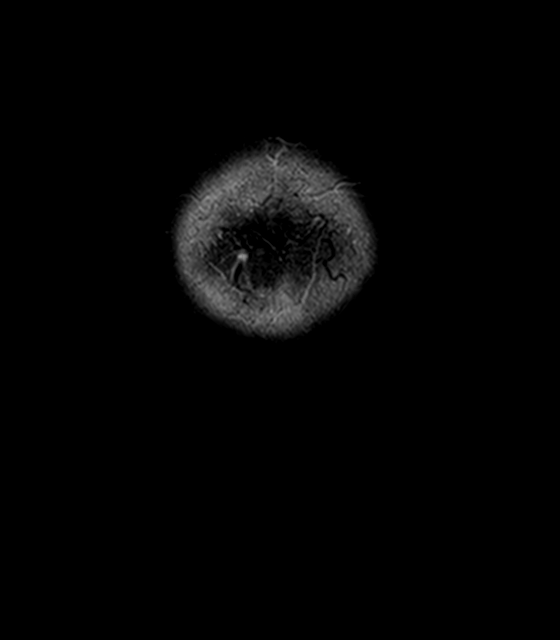

[48 of 48 positions shown; findings below may reference images not displayed]

FINDINGS: Brain: No acute infarction, hemorrhage, hydrocephalus, extra-axial
collection or mass lesion. The brain parenchyma has normal
morphology and signal characteristics.

Vascular: Normal flow voids.

Skull and upper cervical spine: Normal marrow signal.

Sinuses/Orbits: Mucosal thickening of the bilateral ethmoid cells
and maxillary sinuses. The orbits are maintained.

Other: None.
IMPRESSION: Unremarkable MRI of the brain.

## 2021-12-06 DIAGNOSIS — Z1231 Encounter for screening mammogram for malignant neoplasm of breast: Secondary | ICD-10-CM | POA: Diagnosis not present

## 2022-01-08 DIAGNOSIS — N912 Amenorrhea, unspecified: Secondary | ICD-10-CM | POA: Diagnosis not present

## 2022-01-08 DIAGNOSIS — N809 Endometriosis, unspecified: Secondary | ICD-10-CM | POA: Diagnosis not present

## 2022-01-08 DIAGNOSIS — G43001 Migraine without aura, not intractable, with status migrainosus: Secondary | ICD-10-CM | POA: Diagnosis not present

## 2022-01-22 DIAGNOSIS — Z91018 Allergy to other foods: Secondary | ICD-10-CM | POA: Diagnosis not present

## 2022-01-22 DIAGNOSIS — J302 Other seasonal allergic rhinitis: Secondary | ICD-10-CM | POA: Diagnosis not present

## 2022-01-22 DIAGNOSIS — H1013 Acute atopic conjunctivitis, bilateral: Secondary | ICD-10-CM | POA: Diagnosis not present

## 2022-03-08 DIAGNOSIS — M180 Bilateral primary osteoarthritis of first carpometacarpal joints: Secondary | ICD-10-CM | POA: Diagnosis not present

## 2022-08-21 DIAGNOSIS — L918 Other hypertrophic disorders of the skin: Secondary | ICD-10-CM | POA: Diagnosis not present

## 2022-08-21 DIAGNOSIS — D485 Neoplasm of uncertain behavior of skin: Secondary | ICD-10-CM | POA: Diagnosis not present

## 2022-12-05 DIAGNOSIS — M180 Bilateral primary osteoarthritis of first carpometacarpal joints: Secondary | ICD-10-CM | POA: Diagnosis not present

## 2023-03-11 DIAGNOSIS — I10 Essential (primary) hypertension: Secondary | ICD-10-CM | POA: Diagnosis not present

## 2023-03-11 DIAGNOSIS — R519 Headache, unspecified: Secondary | ICD-10-CM | POA: Diagnosis not present

## 2023-03-11 DIAGNOSIS — E7800 Pure hypercholesterolemia, unspecified: Secondary | ICD-10-CM | POA: Diagnosis not present

## 2023-03-11 DIAGNOSIS — F321 Major depressive disorder, single episode, moderate: Secondary | ICD-10-CM | POA: Diagnosis not present

## 2023-03-11 DIAGNOSIS — Z9109 Other allergy status, other than to drugs and biological substances: Secondary | ICD-10-CM | POA: Diagnosis not present

## 2023-03-11 DIAGNOSIS — R7301 Impaired fasting glucose: Secondary | ICD-10-CM | POA: Diagnosis not present

## 2023-03-11 DIAGNOSIS — E559 Vitamin D deficiency, unspecified: Secondary | ICD-10-CM | POA: Diagnosis not present

## 2023-03-12 DIAGNOSIS — E7800 Pure hypercholesterolemia, unspecified: Secondary | ICD-10-CM | POA: Diagnosis not present

## 2023-03-12 DIAGNOSIS — R7301 Impaired fasting glucose: Secondary | ICD-10-CM | POA: Diagnosis not present

## 2023-03-12 DIAGNOSIS — E559 Vitamin D deficiency, unspecified: Secondary | ICD-10-CM | POA: Diagnosis not present

## 2023-03-12 DIAGNOSIS — I10 Essential (primary) hypertension: Secondary | ICD-10-CM | POA: Diagnosis not present

## 2023-04-02 DIAGNOSIS — J309 Allergic rhinitis, unspecified: Secondary | ICD-10-CM | POA: Diagnosis not present

## 2023-04-02 DIAGNOSIS — J329 Chronic sinusitis, unspecified: Secondary | ICD-10-CM | POA: Diagnosis not present

## 2023-06-12 DIAGNOSIS — Z01419 Encounter for gynecological examination (general) (routine) without abnormal findings: Secondary | ICD-10-CM | POA: Diagnosis not present

## 2023-06-12 DIAGNOSIS — G43829 Menstrual migraine, not intractable, without status migrainosus: Secondary | ICD-10-CM | POA: Diagnosis not present

## 2023-06-12 DIAGNOSIS — R7309 Other abnormal glucose: Secondary | ICD-10-CM | POA: Diagnosis not present

## 2023-06-12 DIAGNOSIS — R030 Elevated blood-pressure reading, without diagnosis of hypertension: Secondary | ICD-10-CM | POA: Diagnosis not present

## 2023-06-23 DIAGNOSIS — M180 Bilateral primary osteoarthritis of first carpometacarpal joints: Secondary | ICD-10-CM | POA: Diagnosis not present

## 2023-06-23 DIAGNOSIS — M24331 Pathological dislocation of right wrist, not elsewhere classified: Secondary | ICD-10-CM | POA: Diagnosis not present

## 2023-07-17 DIAGNOSIS — S63071A Subluxation of distal end of right ulna, initial encounter: Secondary | ICD-10-CM | POA: Diagnosis not present

## 2023-07-17 DIAGNOSIS — M25341 Other instability, right hand: Secondary | ICD-10-CM | POA: Diagnosis not present

## 2023-07-17 DIAGNOSIS — M25331 Other instability, right wrist: Secondary | ICD-10-CM | POA: Diagnosis not present

## 2023-07-17 DIAGNOSIS — X58XXXA Exposure to other specified factors, initial encounter: Secondary | ICD-10-CM | POA: Diagnosis not present

## 2023-07-17 DIAGNOSIS — S63051A Subluxation of other carpometacarpal joint of right hand, initial encounter: Secondary | ICD-10-CM | POA: Diagnosis not present

## 2023-08-05 DIAGNOSIS — Z4889 Encounter for other specified surgical aftercare: Secondary | ICD-10-CM | POA: Diagnosis not present

## 2023-08-05 DIAGNOSIS — M24331 Pathological dislocation of right wrist, not elsewhere classified: Secondary | ICD-10-CM | POA: Diagnosis not present

## 2023-08-13 DIAGNOSIS — Z3041 Encounter for surveillance of contraceptive pills: Secondary | ICD-10-CM | POA: Diagnosis not present

## 2023-08-13 DIAGNOSIS — N939 Abnormal uterine and vaginal bleeding, unspecified: Secondary | ICD-10-CM | POA: Diagnosis not present

## 2023-08-13 DIAGNOSIS — R7309 Other abnormal glucose: Secondary | ICD-10-CM | POA: Diagnosis not present

## 2023-08-29 DIAGNOSIS — M24331 Pathological dislocation of right wrist, not elsewhere classified: Secondary | ICD-10-CM | POA: Diagnosis not present

## 2023-08-29 DIAGNOSIS — M180 Bilateral primary osteoarthritis of first carpometacarpal joints: Secondary | ICD-10-CM | POA: Diagnosis not present

## 2023-08-29 DIAGNOSIS — M79644 Pain in right finger(s): Secondary | ICD-10-CM | POA: Diagnosis not present

## 2023-09-10 DIAGNOSIS — M79644 Pain in right finger(s): Secondary | ICD-10-CM | POA: Diagnosis not present

## 2023-09-10 DIAGNOSIS — Z789 Other specified health status: Secondary | ICD-10-CM | POA: Diagnosis not present

## 2024-06-30 ENCOUNTER — Ambulatory Visit
